# Patient Record
Sex: Female | Born: 2000 | Race: White | Hispanic: No | Marital: Single | Smoking: Never smoker
Health system: Southern US, Community
[De-identification: ages and names within clinical notes are randomized; demographics above are authoritative.]

## PROBLEM LIST (undated history)

## (undated) DIAGNOSIS — L709 Acne, unspecified: Secondary | ICD-10-CM

## (undated) HISTORY — DX: Acne, unspecified: L70.9

---
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
## 2020-10-22 ENCOUNTER — Ambulatory Visit
Admission: RE | Admit: 2020-10-22 | Discharge: 2020-10-22 | Disposition: A | Discharge status: Home / Self Care | Payer: 59 | Source: Ambulatory Visit | Attending: Family | Admitting: Family

## 2020-10-22 ENCOUNTER — Other Ambulatory Visit: Payer: Self-pay | Admitting: Family

## 2020-10-22 DIAGNOSIS — R053 Chronic cough: Secondary | ICD-10-CM

## 2021-12-25 IMAGING — CR DG CHEST 2V
2 series · 2 of 2 positions shown · non-contrast
Comparison: 05/04/2018.

CLINICAL DATA: Chronic cough.

EXAM:
CHEST - 2 VIEW

[w chest pa]
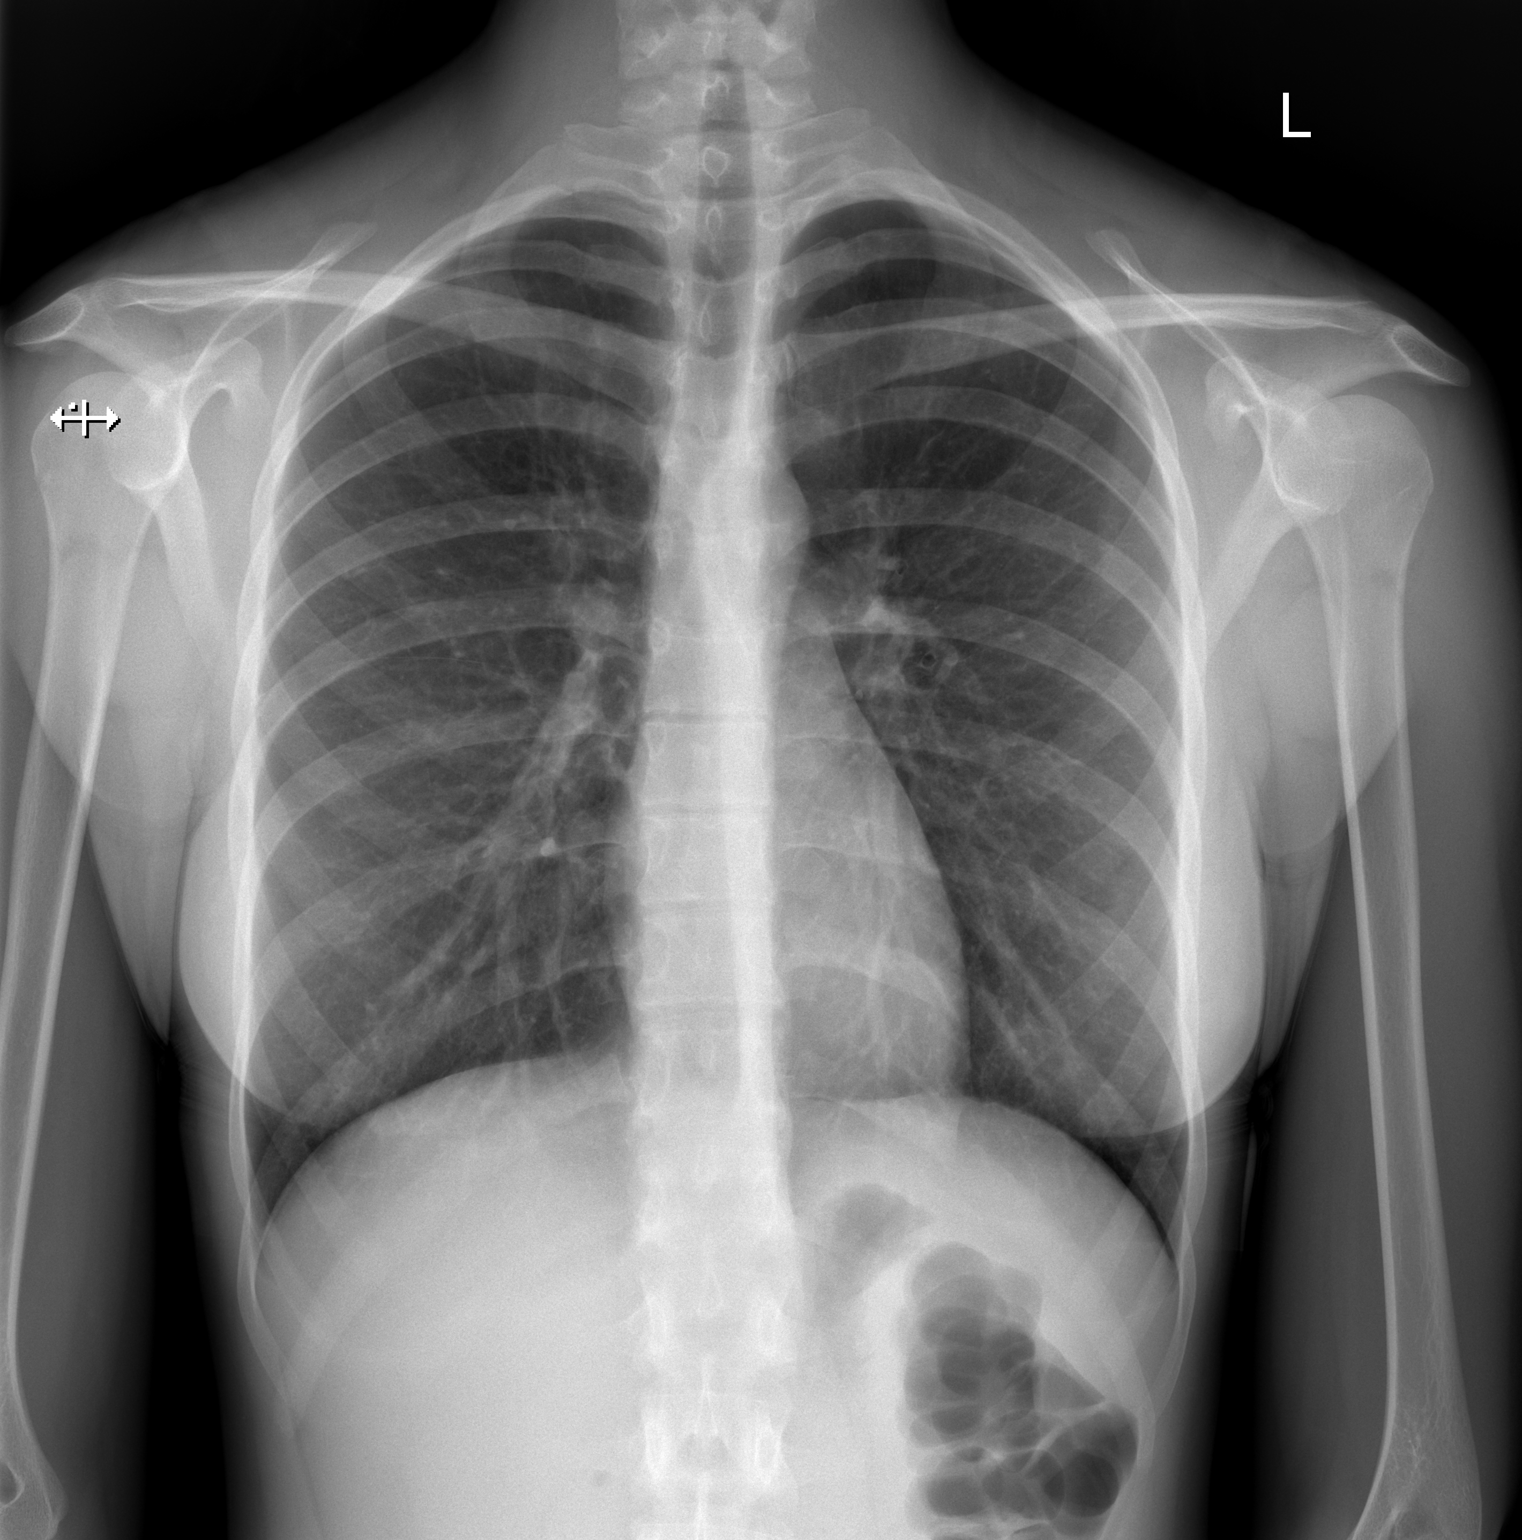

[w chest lat]
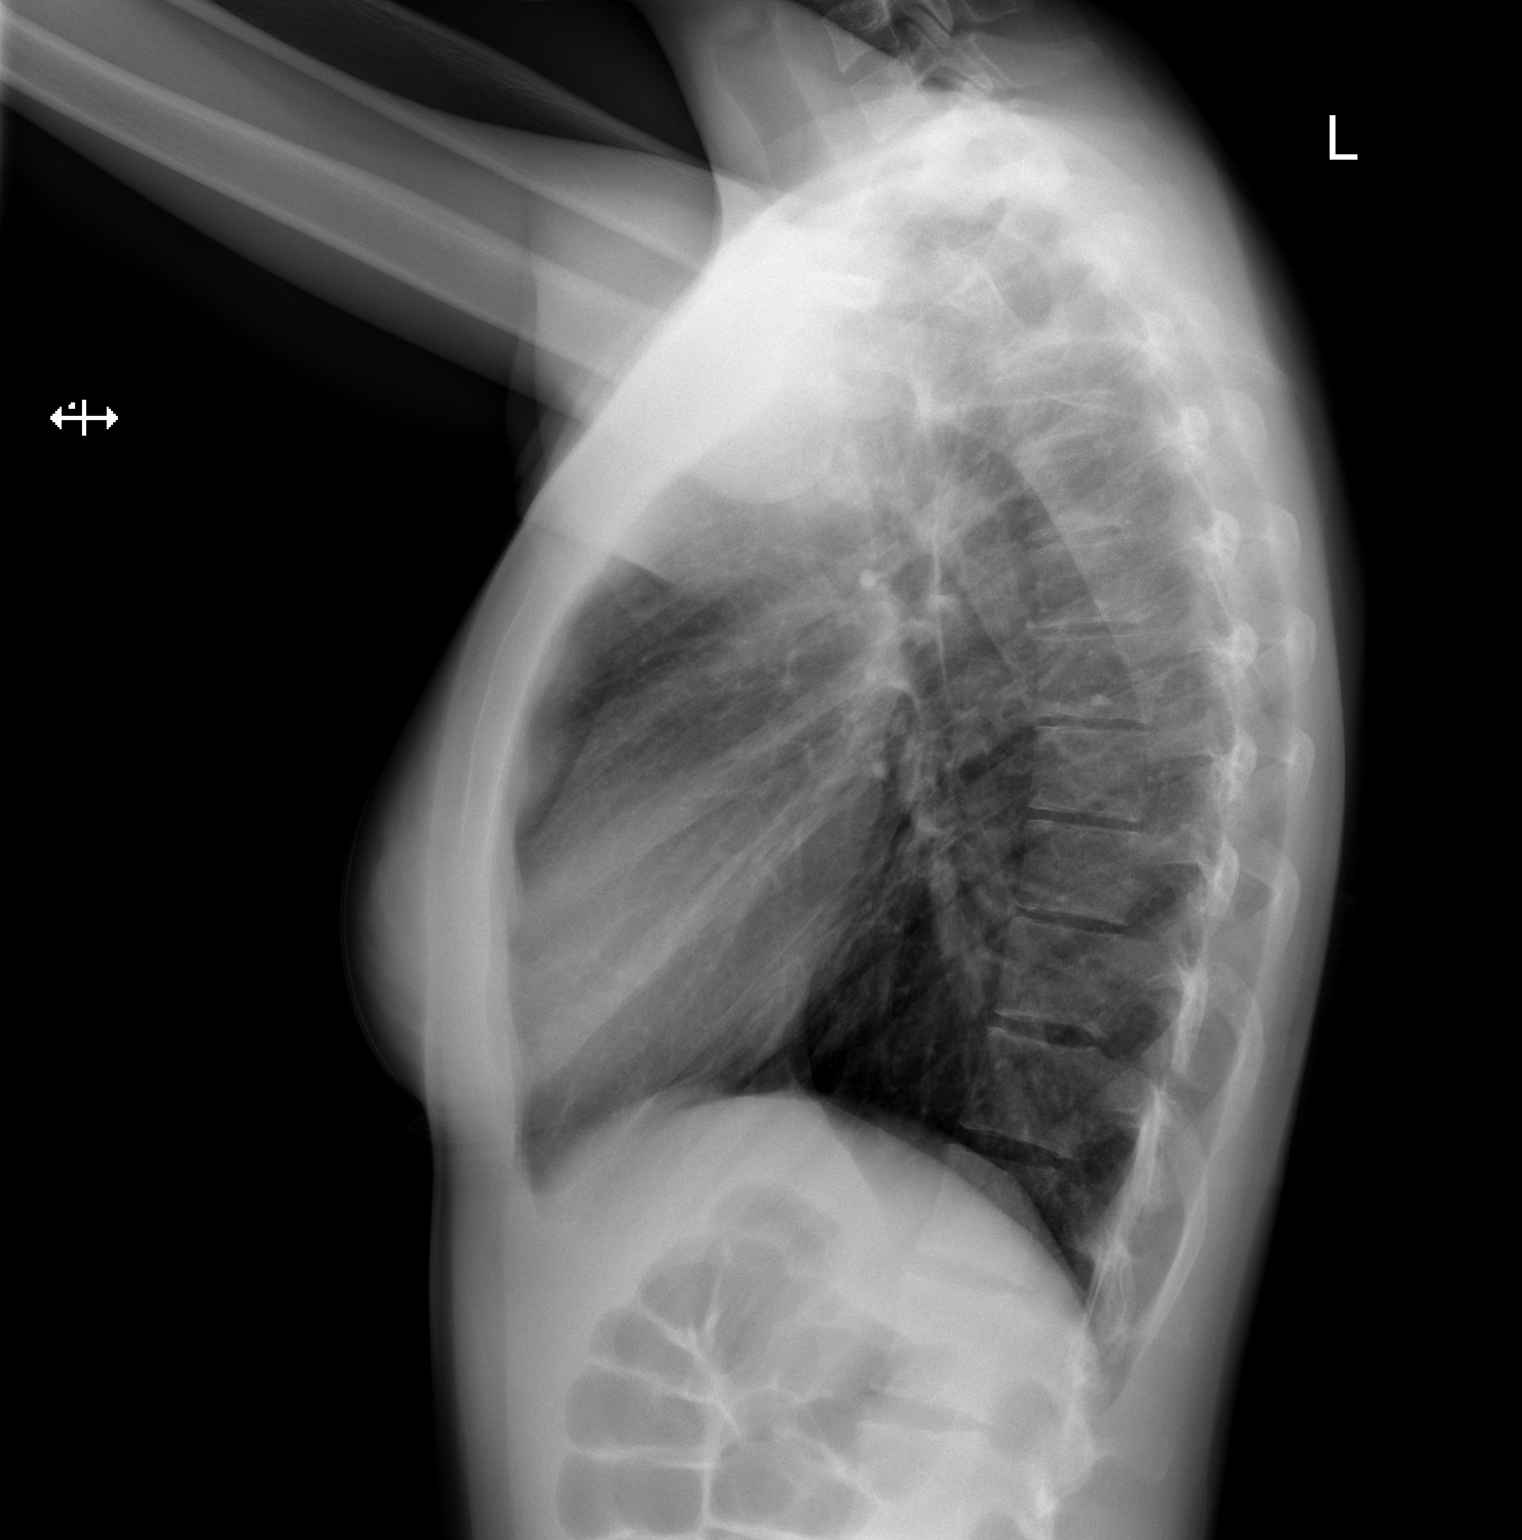

[2 of 2 positions shown; findings below may reference images not displayed]

FINDINGS: Mediastinum and hilar structures normal. Heart size normal. Lungs
are clear. Previously identified infiltrates on prior chest x-ray of
05/04/2018 have cleared. No pleural effusion or pneumothorax. No
acute bony abnormality.
IMPRESSION: No acute cardiopulmonary disease.
